# Patient Record
Sex: Female | Born: 1990 | Race: White | Hispanic: No | Marital: Single | Smoking: Never smoker
Health system: Southern US, Community
[De-identification: ages and names within clinical notes are randomized; demographics above are authoritative.]

---
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
## 2010-01-17 ENCOUNTER — Ambulatory Visit: Payer: Self-pay | Admitting: Family Medicine

## 2010-01-17 DIAGNOSIS — M549 Dorsalgia, unspecified: Secondary | ICD-10-CM | POA: Insufficient documentation

## 2010-06-15 NOTE — Progress Notes (Signed)
Summary: Junel 1-20  Phone Note Refill Request Message from:  Scriptline on Sep 12, 2009 11:38 AM  Refills Requested: Medication #1:  LOESTRIN 1/20 (21) 1-20 MG-MCG TABS 1 by mouth once daily as directed. [facility]  [address] [id number]   Last Fill Date:  No date sent   Pharmacy Phone:  [phone]    Does she need a Pap smear?   Method Requested: Electronic Initial call taken by: Delilah Shan CMA Duncan Dull),  Sep 12, 2009 11:38 AM  Follow-up for Phone Call        px written on EMR for call in schedule check up with me in august please  Follow-up by: Judith Part MD,  Sep 12, 2009 1:18 PM  Additional Follow-up for Phone Call Additional follow up Details #1::        Patient notified as instructed by telephone. Pt scheduled CPX with pap on 12/05/09 at 2:30pm because pt is leaving the forst of Aug for school.Medication [facility]oned to [facility] as instructed. Lewanda Rife LPN  Sep 13, 2954 4:14 PM     Prescriptions: LOESTRIN 1/20 (21) 1-20 MG-MCG TABS (NORETHINDRONE ACET-ETHINYL EST) 1 by mouth once daily as directed  #1 pack x 5   Entered and Authorized by:   Judith Part MD   Signed by:   Lewanda Rife LPN on 21/30/8657   Method used:   Telephoned to ...       [facility]  [address] (retail)       [address]       [place], [place]  [address]       Ph: [phone] or [phone]       Fax: [phone]   RxID:   [id number]

## 2010-06-15 NOTE — Assessment & Plan Note (Signed)
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  had orientation july 11th   school starts aug 15th  is going to study elementry ed -- will transfer to [employer]    pt OC for painful menses  on loestrin 1/20 -- really likes it but hard to remember to take it the same time every day  when she does take it regularly -- cramps are improved  bleeding is quite light -- but long - 7 days   is interested in trying the patch - ortho evra   is sexually active and using condoms  does not want std tests at all - one partner- no prev partners for either  been with this partner over 3 years -- dating since middle school   not a smoker at all   wt is up 13 lb-- with good bmi  good bp 114/70  Td 05   on lo estrin   hpv vaccine- has thought a bit about it -- and wants to think about it   will not be playing sports in school plans to use the work out center in her apt and also pool   has several moles- on back and also has a hard mole on her L abdomen  also some bumps on legs - ? warts   Allergies: 1)  ! Sulfa 2)  ! Sulfa 3)  ! Codeine  Past History:  Past Medical History: shoulder injury dysmenorrhea   Review of Systems General:  Denies fatigue, loss of appetite, and malaise. Eyes:  Denies blurring and eye irritation. CV:  Denies chest pain or discomfort, palpitations, and shortness of breath with exertion. Resp:  Denies cough, shortness of breath, and wheezing. GI:  Denies abdominal  pain, change in bowel habits, indigestion, and nausea. GU:  Denies abnormal vaginal bleeding, discharge, and dysuria. MS:  Denies joint pain. Derm:  Denies itching, lesion(s), poor wound healing, and rash. Neuro:  Denies numbness and tingling. Psych:  Denies anxiety. Endo:  Denies cold intolerance, excessive thirst, excessive urination, and heat intolerance. Heme:  Denies abnormal bruising and bleeding.  Physical Exam  General:  Well-developed,well-nourished,in no acute distress; alert,appropriate and cooperative throughout examination Head:  normocephalic, atraumatic, and no abnormalities observed.   Eyes:  vision grossly intact, pupils equal, pupils round, and pupils reactive to light.  no conjunctival pallor, injection or icterus  Ears:  R ear normal and L ear normal.   Nose:  no nasal discharge.   Mouth:  pharynx pink and moist.   Neck:  supple with full rom and no masses or thyromegally, no JVD or carotid bruit  Chest Wall:  No deformities, masses, or tenderness noted. Breasts:  No mass, nodules, thickening, tenderness, bulging, retraction, inflamation, nipple discharge or skin changes noted.   Lungs:  Normal  respiratory effort, chest expands symmetrically. Lungs are clear to auscultation, no crackles or wheezes. Heart:  Normal rate and regular rhythm. S1 and S2 normal without gallop, murmur, click, rub or other extra sounds. Abdomen:  Bowel sounds positive,abdomen soft and non-tender without masses, organomegaly or hernias noted. Msk:  No deformity or scoliosis noted of thoracic or lumbar spine.  no acute joint changes Pulses:  pulses normal in all 4 extremities Extremities:  no cyanosis or deformity noted with normal full range of motion of all joints Neurologic:  sensation intact to light touch, gait normal, and DTRs symmetrical and normal.   Skin:  generally tanned  lesion mid back over 1 cm- raised and brown with white spot mobile firm 1/2 cm lesion L abd - resembles  dermatofibroma several small wart like lesions on legs  Cervical Nodes:  No lymphadenopathy noted Axillary Nodes:  No palpable lymphadenopathy Inguinal Nodes:  No significant adenopathy Psych:  normal affect, talkative and pleasant    Impression & Recommendations:  Problem # 1:  HEALTH MAINTENANCE EXAM (ICD-V70.0) Assessment Comment Only with good health habits overall reviewed health habits including diet, exercise and skin cancer prevention reviewed health maintenance list and family history pt declines any std screening today- is with one partner and uses condoms  declnes hpv vaccine at this time - but may think about it - and literature given to read  pt not interested in meningitis vaccine - but will get it when she transitions to bigger school with dorm in 2 years   Problem # 2:  DYSMENORRHEA (ICD-625.3) Assessment: Improved overall some trouble remb to take pill daily at the same time (although overall much imp with pill) wants to try ortho evra patch disc risk of blood clots with this - pt is low risk given she is slim and non smoker  will change to this after next menses continue condom use  given info about hpv vaccine and strongly recommended it   Problem # 3:  NEOPLASM OF UNCERTAIN BEHAVIOR OF SKIN (ICD-238.2) Assessment: New brown nevus - on mid back- not new/ raised with white spot in it  firm .5 cm nevus on L abd that is mobile- resembles dermatofibroma  few small wart like growths on her knees and legs  ref to derm disc imp of sunscreen use  Orders: Dermatology Referral (Derma)  Complete Medication List: 1)  Ortho Evra 150-20 Mcg/24hr Ptwk (Norelgestromin-eth estradiol) .... Apply patch as directed  Patient Instructions: 1)  if you are interested in HPV vaccine (which I recommend ) - is a series of 3 shots  2)  you can get them here or health dept or likely your college [facility] clinic  3)  we will do a dermatology referral at check out  4)  let me know if  you change your mind about std screening  5)  try the ortho evra patch as directed - start the next sunday after your period starts  6)  keep using condoms for std prevention  Prescriptions: ORTHO EVRA 150-20 MCG/24HR PTWK (NORELGESTROMIN-ETH ESTRADIOL) apply patch as directed  #3 x 11   Entered and Authorized by:   Donyae Kohn Ann Dedrick Heffner MD   Signed by:   Joell Buerger Ann Zya Finkle MD on 12/06/2009   Method used:   [facility]ically [facility]        [facility]  [address] [id number]ail)       [address]       [place], [place]  [address]  Ph: [phone] or [phone]       Fax: [phone]   RxID:   [id number]   Current Allergies (reviewed today): ! SULFA ! SULFA ! CODEINE

## 2010-06-15 NOTE — Assessment & Plan Note (Signed)
Summary: DISCUSS CHANGING BCP/CLE   Vital Signs:  Patient profile:   20 year old female Height:      70.75 inches Weight:      171.50 pounds BMI:     24.18 Temp:     98.5 degrees F oral Pulse rate:   76 / minute Pulse rhythm:   regular BP sitting:   120 / 82  (left arm) Cuff size:   regular  Vitals Entered By: Lewanda Rife LPN (January 17, 2010 3:05 PM) CC: Discuss changinig Birth control patch. Patch irritates skin   History of Present Illness: tried the patch and did not really like it -- had gunk around the site and then got irritation under the patch  it was not helping menses as much as as it was   the pill worked really well for her -- very light bleeding and no cramps  wants to get started on it now   is a non smoker   is sexually active and using condoms  no std and not worried , now worried about getting tested is interested in the hpv shot (there is cervical cancer in family )   some mild low back pain on R when getting up and down lately thinks she stained something  does a lot of crunches    Allergies: 1)  ! Sulfa 2)  ! Sulfa 3)  ! Codeine  Past History:  Past Medical History: Last updated: 12/06/2009 shoulder injury dysmenorrhea   Family History: Last updated: 01/20/08 no sudden cardiac death   Social History: Last updated: 12/27/2008 non smoker  lives with parents  cheerleading/ volleyball  Risk Factors: Smoking Status: never (12/04/2006)  Review of Systems General:  Denies fatigue, fever, loss of appetite, and malaise. Eyes:  Denies blurring and eye irritation. CV:  Denies chest pain or discomfort, palpitations, and shortness of breath with exertion. Resp:  Denies cough, shortness of breath, and wheezing. GI:  Denies abdominal pain, bloody stools, change in bowel habits, and nausea. GU:  Denies abnormal vaginal bleeding, discharge, dysuria, and urinary frequency. MS:  Denies cramps and muscle weakness. Derm:  Denies lesion(s),  poor wound healing, and rash. Neuro:  Denies numbness and tingling. Psych:  Denies anxiety and depression. Endo:  Denies cold intolerance, excessive thirst, excessive urination, and heat intolerance. Heme:  Denies abnormal bruising and bleeding.  Physical Exam  General:  Well-developed,well-nourished,in no acute distress; alert,appropriate and cooperative throughout examination Head:  normocephalic, atraumatic, and no abnormalities observed.   Eyes:  vision grossly intact, pupils equal, pupils round, and pupils reactive to light.  no conjunctival pallor, injection or icterus  Neck:  supple with full rom and no masses or thyromegally, no JVD or carotid bruit  Lungs:  Normal respiratory effort, chest expands symmetrically. Lungs are clear to auscultation, no crackles or wheezes. Heart:  Normal rate and regular rhythm. S1 and S2 normal without gallop, murmur, click, rub or other extra sounds. Abdomen:  no suprapubic tenderness or fullness felt  Msk:  nl rom LS with no bony tenderness  Extremities:  No clubbing, cyanosis, edema, or deformity noted with normal full range of motion of all joints.   Neurologic:  gait normal and DTRs symmetrical and normal.   Skin:  Intact without suspicious lesions or rashes Cervical Nodes:  No lymphadenopathy noted Inguinal Nodes:  No significant adenopathy Psych:  normal affect, talkative and pleasant    Impression & Recommendations:  Problem # 1:  DYSMENORRHEA (ICD-625.3) Assessment Comment Only will go  back to OC since that was much more effective disc ways to remb to take the pill  aware does not protect from stds   Problem # 2:  Preventive Health Care (ICD-V70.0) first hpv vaccine today  will get 2nd one at school in 2 mo if not here to f/u  Problem # 3:  BACK PAIN (ICD-724.5) Assessment: New occ low back pain - no neurol symptoms handout given aafp on back pain and exercises  disc good "crunch" technique that does not hurt back consider x ray  if not imp  Complete Medication List: 1)  Loestrin Fe 1/20 1-20 Mg-mcg Tabs (Norethin ace-eth estrad-fe) .Marland Kitchen.. 1 by mouth once daily as directed  Other Orders: HPV Vaccine - 3 sched doses - IM (62703) Admin 1st Vaccine (50093)  Patient Instructions: 1)  hpv vaccine  2)  follow up for 2nd hpv vaccine in 2 months  3)  use heat on low back if it bothers you  4)  start back on the lo estrin pill  Prescriptions: LOESTRIN FE 1/20 1-20 MG-MCG TABS (NORETHIN ACE-ETH ESTRAD-FE) 1 by mouth once daily as directed  #1 pack x 11   Entered and Authorized by:   Judith Part MD   Signed by:   Judith Part MD on 01/17/2010   Method used:   Electronically to        [facility]  [address] (retail)       [address]       [place], [place]  [address]       Ph: [phone] or [phone]       Fax: [phone]   RxID:   [id number]   Current Allergies (reviewed today): ! SULFA ! SULFA ! CODEINE   Immunizations Administered:  HPV # 1:    Vaccine Type: Gardasil    Site: left deltoid    Mfr: Merck    Dose: 0.5 ml    Route: IM    Given by: Lewanda Rife LPN    Exp. Date: 12/12/2011    Lot #: [id number]    VIS given: 09/13/09 version given January 17, 2010.

## 2010-06-15 NOTE — Consult Note (Signed)
Summary: [facility]  [facility]   Imported By: Maryln Gottron 12/29/2009 15:54:38  _____________________________________________________________________  External Attachment:    Type:   Image     Comment:   External Document

## 2013-09-22 ENCOUNTER — Encounter: Payer: Self-pay | Admitting: Family Medicine

## 2013-09-22 ENCOUNTER — Ambulatory Visit (INDEPENDENT_AMBULATORY_CARE_PROVIDER_SITE_OTHER): Payer: Managed Care, Other (non HMO) | Admitting: Family Medicine

## 2013-09-22 VITALS — BP 106/64 | HR 76 | Temp 98.2°F | Ht 71.0 in | Wt 161.5 lb

## 2013-09-22 DIAGNOSIS — Z0000 Encounter for general adult medical examination without abnormal findings: Secondary | ICD-10-CM | POA: Insufficient documentation

## 2013-09-22 DIAGNOSIS — Z23 Encounter for immunization: Secondary | ICD-10-CM

## 2013-09-22 DIAGNOSIS — T753XXA Motion sickness, initial encounter: Secondary | ICD-10-CM

## 2013-09-22 DIAGNOSIS — H6980 Other specified disorders of Eustachian tube, unspecified ear: Secondary | ICD-10-CM | POA: Insufficient documentation

## 2013-09-22 DIAGNOSIS — H6990 Unspecified Eustachian tube disorder, unspecified ear: Secondary | ICD-10-CM

## 2013-09-22 MED ORDER — FLUTICASONE PROPIONATE 50 MCG/ACT NA SUSP: 2.0000 | Freq: Every day | NASAL | Status: AC

## 2013-09-22 MED ORDER — SCOPOLAMINE 1 MG/3DAYS TD PT72: 1.0000 | MEDICATED_PATCH | TRANSDERMAL | Status: AC

## 2013-09-22 NOTE — Patient Instructions (Signed)
For airplane flight -use the flonase for at least a week before flying and then also get afrin nasal spray - and use the 12 hour version - 30 minutes before take off and every 12 hours until flight is over  Tetanus shot today Tdap  Use scopalamine patch for motion sickness  For joints -wear extremely supportive shoes - with inserts if you need to  Have a good safe trip

## 2013-09-22 NOTE — Assessment & Plan Note (Signed)
With upcoming airplane and then cruise She will try sea -bands otc  Also given px for scopalamine patch for use prn -disc poss side eff

## 2013-09-22 NOTE — Assessment & Plan Note (Signed)
Reviewed health habits including diet and exercise and skin cancer prevention Reviewed appropriate screening tests for age  Also reviewed health mt list, fam hx and immunization status , as well as social and family history   Disc healthy diet and exercise and advice for travel Tdap update today

## 2013-09-22 NOTE — Progress Notes (Signed)
Subjective:    Patient ID: Sara Lozano, female    DOB: 1991/04/11, 23 y.o.   MRN: [id number]  HPI Here to re establish for primary care    She is going to school abroad - [employer] [facility] currently - one year left --going to [place] 3 1/2 - school abroad   Doing very well in school - done for the semester  Studying sport/ rec management -  Wants to do event planning in the future for recreation  Has questions about immunizations   Had the chicken pox but not the vaccine   Works at [employer]   Concerns about ear popping on airplane Last time she was on a plane her ears were severely painful - what can she do to prevent this   Goes to the gym for exercise off and on  Very active job   Healthy wt bmi is 6  On [place]- she goes to gyn in [place] - better with OC / but still painful  Pap- Aug 2014   Td vaccine 8/05- wants to update that  Did not have a flu vaccine this season  Had HPv imms   Goes to Dr Nevada Crane for derm -mother has melanoma (caught early)  Patient Active Problem List   Diagnosis Date Noted  . BACK PAIN 01/17/2010  . NEOPLASM OF UNCERTAIN BEHAVIOR OF SKIN 12/06/2009  . DYSMENORRHEA 12/27/2008  . SHOULDER PAIN, RIGHT 01/02/2007   No past medical history on file. No past surgical history on file. History  Substance Use Topics  . Smoking status: Never Smoker   . Smokeless tobacco: Not on file  . Alcohol Use: Yes     Comment: socially   No family history on file. Allergies  Allergen Reactions  . Codeine     REACTION: nausea and vomiting  . Sulfonamide Derivatives     REACTION: u/k   No current outpatient prescriptions on file prior to visit.   No current facility-administered medications on file prior to visit.      Review of Systems  Review of Systems  Constitutional: Negative for fever, appetite change, fatigue and unexpected weight change.  Eyes: Negative for pain and visual disturbance.  ENT pos for all rhinitis    Respiratory: Negative for cough and shortness of breath.   Cardiovascular: Negative for cp or palpitations    Gastrointestinal: Negative for nausea, diarrhea and constipation.  Genitourinary: Negative for urgency and frequency.  Skin: Negative for pallor or rash   MSK pos for flat feet  Neurological: Negative for weakness, light-headedness, numbness and headaches.  Hematological: Negative for adenopathy. Does not bruise/bleed easily.  Psychiatric/Behavioral: Negative for dysphoric mood. The patient is not nervous/anxious.         Objective:   Physical Exam  Constitutional: She appears well-developed and well-nourished. No distress.  HENT:  Head: Normocephalic and atraumatic.  Right Ear: External ear normal.  Left Ear: External ear normal.  Nose: Nose normal.  Mouth/Throat: Oropharynx is clear and moist.  Eyes: Conjunctivae and EOM are normal. Pupils are equal, round, and reactive to light. Right eye exhibits no discharge. Left eye exhibits no discharge. No scleral icterus.  Neck: Normal range of motion. Neck supple. No JVD present. No thyromegaly present.  Cardiovascular: Normal rate, regular rhythm, normal heart sounds and intact distal pulses.  Exam reveals no gallop.   Pulmonary/Chest: Effort normal and breath sounds normal. No respiratory distress. She has no wheezes. She has no rales.  Abdominal: Soft. Bowel  sounds are normal. She exhibits no distension and no mass. There is no tenderness.  Musculoskeletal: She exhibits no edema and no tenderness.  Lymphadenopathy:    She has no cervical adenopathy.  Neurological: She is alert. She has normal reflexes. No cranial nerve deficit. She exhibits normal muscle tone. Coordination normal.  Skin: Skin is warm and dry. No rash noted. No erythema. No pallor.  Stable nevi-back and abdomen  Psychiatric: She has a normal mood and affect.          Assessment & Plan:

## 2013-09-22 NOTE — Assessment & Plan Note (Signed)
For airplane flight - px flonase to start at least a week before Also afrin 12 hour - to use when flying actively Update if this does not help

## 2013-09-22 NOTE — Progress Notes (Signed)
Pre visit review using our clinic review tool, if applicable. No additional management support is needed unless otherwise documented below in the visit note.
